# Patient Record
Sex: Female | Born: 1990 | Race: White | Hispanic: No | Marital: Married | Smoking: Former smoker
Health system: Southern US, Community
[De-identification: ages and names within clinical notes are randomized; demographics above are authoritative.]

## PROBLEM LIST (undated history)

## (undated) ENCOUNTER — Inpatient Hospital Stay (HOSPITAL_COMMUNITY): Payer: Self-pay

## (undated) DIAGNOSIS — J343 Hypertrophy of nasal turbinates: Secondary | ICD-10-CM

## (undated) DIAGNOSIS — J321 Chronic frontal sinusitis: Secondary | ICD-10-CM

## (undated) DIAGNOSIS — G43909 Migraine, unspecified, not intractable, without status migrainosus: Secondary | ICD-10-CM

## (undated) DIAGNOSIS — Z8614 Personal history of Methicillin resistant Staphylococcus aureus infection: Secondary | ICD-10-CM

## (undated) DIAGNOSIS — J322 Chronic ethmoidal sinusitis: Secondary | ICD-10-CM

## (undated) DIAGNOSIS — J342 Deviated nasal septum: Secondary | ICD-10-CM

## (undated) DIAGNOSIS — J320 Chronic maxillary sinusitis: Secondary | ICD-10-CM

## (undated) DIAGNOSIS — J3081 Allergic rhinitis due to animal (cat) (dog) hair and dander: Secondary | ICD-10-CM

## (undated) DIAGNOSIS — R0989 Other specified symptoms and signs involving the circulatory and respiratory systems: Secondary | ICD-10-CM

## (undated) DIAGNOSIS — J323 Chronic sphenoidal sinusitis: Secondary | ICD-10-CM

## (undated) DIAGNOSIS — Z9109 Other allergy status, other than to drugs and biological substances: Secondary | ICD-10-CM

## (undated) HISTORY — PX: WISDOM TOOTH EXTRACTION: SHX21

---

## 2012-09-01 DIAGNOSIS — Z8614 Personal history of Methicillin resistant Staphylococcus aureus infection: Secondary | ICD-10-CM

## 2012-09-01 HISTORY — DX: Personal history of Methicillin resistant Staphylococcus aureus infection: Z86.14

## 2013-08-23 HISTORY — PX: IRRIGATION AND DEBRIDEMENT FOOT: SHX6602

## 2014-12-01 HISTORY — PX: DIAGNOSTIC LAPAROSCOPY: SUR761

## 2016-04-30 ENCOUNTER — Ambulatory Visit (INDEPENDENT_AMBULATORY_CARE_PROVIDER_SITE_OTHER): Payer: Medicaid Other | Admitting: *Deleted

## 2016-04-30 DIAGNOSIS — Z3200 Encounter for pregnancy test, result unknown: Secondary | ICD-10-CM

## 2016-04-30 DIAGNOSIS — Z3201 Encounter for pregnancy test, result positive: Secondary | ICD-10-CM

## 2016-04-30 LAB — POCT URINE PREGNANCY: Preg Test, Ur: POSITIVE — AB

## 2016-04-30 NOTE — Patient Instructions (Signed)
Gave pt instructions to return to [facility] to [facility] after 05/26/2016 for NOB, gave precautions for MAU visits, and PNV samples.

## 2016-05-01 ENCOUNTER — Inpatient Hospital Stay (HOSPITAL_COMMUNITY): Payer: Medicaid Other

## 2016-05-01 ENCOUNTER — Encounter (HOSPITAL_COMMUNITY): Payer: Self-pay | Admitting: *Deleted

## 2016-05-01 ENCOUNTER — Inpatient Hospital Stay (HOSPITAL_COMMUNITY)
Admission: AD | Admit: 2016-05-01 | Discharge: 2016-05-01 | Disposition: A | Discharge status: Home / Self Care | Payer: Medicaid Other | Source: Ambulatory Visit | Attending: Obstetrics and Gynecology | Admitting: Obstetrics and Gynecology

## 2016-05-01 DIAGNOSIS — O26899 Other specified pregnancy related conditions, unspecified trimester: Secondary | ICD-10-CM

## 2016-05-01 DIAGNOSIS — R109 Unspecified abdominal pain: Secondary | ICD-10-CM

## 2016-05-01 DIAGNOSIS — R509 Fever, unspecified: Secondary | ICD-10-CM | POA: Diagnosis present

## 2016-05-01 DIAGNOSIS — Z3A01 Less than 8 weeks gestation of pregnancy: Secondary | ICD-10-CM | POA: Insufficient documentation

## 2016-05-01 DIAGNOSIS — O219 Vomiting of pregnancy, unspecified: Secondary | ICD-10-CM | POA: Diagnosis not present

## 2016-05-01 DIAGNOSIS — O99611 Diseases of the digestive system complicating pregnancy, first trimester: Secondary | ICD-10-CM | POA: Insufficient documentation

## 2016-05-01 DIAGNOSIS — O26891 Other specified pregnancy related conditions, first trimester: Secondary | ICD-10-CM | POA: Insufficient documentation

## 2016-05-01 DIAGNOSIS — R197 Diarrhea, unspecified: Secondary | ICD-10-CM | POA: Diagnosis present

## 2016-05-01 DIAGNOSIS — K219 Gastro-esophageal reflux disease without esophagitis: Secondary | ICD-10-CM | POA: Diagnosis not present

## 2016-05-01 DIAGNOSIS — R110 Nausea: Secondary | ICD-10-CM | POA: Diagnosis not present

## 2016-05-01 DIAGNOSIS — Z87891 Personal history of nicotine dependence: Secondary | ICD-10-CM | POA: Diagnosis not present

## 2016-05-01 DIAGNOSIS — O3680X0 Pregnancy with inconclusive fetal viability, not applicable or unspecified: Secondary | ICD-10-CM

## 2016-05-01 DIAGNOSIS — K529 Noninfective gastroenteritis and colitis, unspecified: Secondary | ICD-10-CM

## 2016-05-01 DIAGNOSIS — Z3491 Encounter for supervision of normal pregnancy, unspecified, first trimester: Secondary | ICD-10-CM

## 2016-05-01 DIAGNOSIS — R1110 Vomiting, unspecified: Secondary | ICD-10-CM | POA: Diagnosis present

## 2016-05-01 LAB — URINALYSIS, ROUTINE W REFLEX MICROSCOPIC
Bilirubin Urine: NEGATIVE
GLUCOSE, UA: NEGATIVE mg/dL
Ketones, ur: NEGATIVE mg/dL
Leukocytes, UA: NEGATIVE
Nitrite: NEGATIVE
PH: 6 (ref 5.0–8.0)
Protein, ur: NEGATIVE mg/dL
SPECIFIC GRAVITY, URINE: 1.01 (ref 1.005–1.030)

## 2016-05-01 LAB — WET PREP, GENITAL
CLUE CELLS WET PREP: NONE SEEN
Sperm: NONE SEEN
TRICH WET PREP: NONE SEEN
YEAST WET PREP: NONE SEEN

## 2016-05-01 LAB — URINE MICROSCOPIC-ADD ON: WBC, UA: NONE SEEN WBC/hpf (ref 0–5)

## 2016-05-01 LAB — CBC
HEMATOCRIT: 37.7 % (ref 36.0–46.0)
Hemoglobin: 13.3 g/dL (ref 12.0–15.0)
MCH: 32 pg (ref 26.0–34.0)
MCHC: 35.3 g/dL (ref 30.0–36.0)
MCV: 90.6 fL (ref 78.0–100.0)
PLATELETS: 181 10*3/uL (ref 150–400)
RBC: 4.16 MIL/uL (ref 3.87–5.11)
RDW: 12.7 % (ref 11.5–15.5)
WBC: 8.6 10*3/uL (ref 4.0–10.5)

## 2016-05-01 LAB — HCG, QUANTITATIVE, PREGNANCY: hCG, Beta Chain, Quant, S: 31970 m[IU]/mL — ABNORMAL HIGH (ref <5)

## 2016-05-01 LAB — ABO/RH: ABO/RH(D): A POS

## 2016-05-01 MED ORDER — METOCLOPRAMIDE HCL 10 MG PO TABS: 10.0000 mg | ORAL_TABLET | Freq: Three times a day (TID) | ORAL | 0 refills | Status: DC | PRN

## 2016-05-01 MED ORDER — LOPERAMIDE HCL 2 MG PO CAPS: 2.0000 mg | ORAL_CAPSULE | Freq: Once | ORAL | Status: DC

## 2016-05-01 MED ORDER — METOCLOPRAMIDE HCL 10 MG PO TABS: 10.0000 mg | ORAL_TABLET | Freq: Three times a day (TID) | ORAL | Status: DC | PRN

## 2016-05-01 NOTE — MAU Note (Signed)
Pt had a fever this morning - 99.9, took tylenol.  Then temp was 100.4.  Has had vomiting & diarrhea for the past week.  Has had diarrhea 7-8 times in last 24 hours.  Has lower abd cramping, denies bleeding.

## 2016-05-01 NOTE — MAU Provider Note (Addendum)
History     CSN: [id number]  Arrival date and time: 05/01/16 1253   First Provider Initiated Contact with Patient 05/01/16 1557      Chief Complaint  Patient presents with  . Diarrhea  . Emesis During Pregnancy  . Fever   Wanda Dalton is a 25yo [id number] at 7812w6d by LMP who presents today with fever, diarrhea and vomiting.  The fever has been present since this am with the highest temp being 100.4 F.  Pt tried Tylenol which did not relieve her symptoms.  She reports chills and rhinorrhea currently.  Denies recent sick contacts, sore throat.    The diarrhea and vomiting have been present for the past week.  Pt has not vomited today and previous vomit was normal gastric contents.  She has been drinking fluids and tolerating that well.  7-8 episodes of diarrhea in past 24 hrs.  She admits lower abdominal cramps with pain radiating to her back.  Denies blood in her stool and upper abdominal pain.  Denies vaginal bleeding and discharge.        OB History    Gravida Para Term Preterm AB Living   4 2   2 1      SAB TAB Ectopic Multiple Live Births   1       2      Past Medical History:  Diagnosis Date  . GERD (gastroesophageal reflux disease)   . Headache   . Seasonal allergies     Past Surgical History:  Procedure Laterality Date  . FOOT SURGERY    . LAPAROSCOPY      Family History  Problem Relation Age of Onset  . Cancer Mother   . Cancer Maternal Grandfather     Social History  Substance Use Topics  . Smoking status: Former Games developermoker  . Smokeless tobacco: Former NeurosurgeonUser  . Alcohol use No    Allergies: Allergies not on file  No prescriptions prior to admission.    Review of Systems  Constitutional: Positive for chills and fever. Negative for weight loss.  HENT: Positive for congestion. Negative for sore throat.   Eyes: Negative for blurred vision and double vision.  Respiratory: Negative for cough, sputum production and shortness of breath.   Gastrointestinal:  Positive for abdominal pain, diarrhea, nausea and vomiting. Negative for blood in stool and constipation.  Genitourinary: Negative for dysuria, flank pain and hematuria.  Musculoskeletal: Positive for myalgias.  Skin: Negative for rash.   Physical Exam   Blood pressure 133/64, pulse 71, temperature 97.7 F (36.5 C), temperature source Oral, resp. rate 18, height 5\' 11"  (1.803 m), weight 88 kg (194 lb), last menstrual period 03/14/2016.  Physical Exam  Nursing note and vitals reviewed. Constitutional: She is oriented to person, place, and time. She appears well-developed and well-nourished. No distress.  HENT:  Head: Normocephalic.  Mouth/Throat: No oropharyngeal exudate.  Clear rhinorrhea present  Cardiovascular: Normal rate, regular rhythm and normal heart sounds.   Respiratory: Effort normal and breath sounds normal. No respiratory distress.  GI: Soft. Normal appearance and bowel sounds are normal. She exhibits no distension and no mass. There is no tenderness. There is no guarding.  Genitourinary: Uterus normal. Vaginal discharge found.  Genitourinary Comments: Labia without erythema or lesions.  Speculum exam revealed pink rugae, small amount of white discharge around cervical os.  No CMT or fundal tenderness on bimanual  Musculoskeletal: Normal range of motion.  Neurological: She is alert and oriented to person, place, and time.  Skin:  Skin is warm and dry. No rash noted. She is not diaphoretic. No erythema.  Psychiatric: She has a normal mood and affect. Her behavior is normal.   Results for orders placed or performed during the hospital encounter of 05/01/16 (from the past 24 hour(s))  Urinalysis, Routine w reflex microscopic (not at [facility])     Status: Abnormal   Collection Time: 05/01/16  2:04 PM  Result Value Ref Range   Color, Urine YELLOW YELLOW   APPearance CLEAR CLEAR   Specific Gravity, Urine 1.010 1.005 - 1.030   pH 6.0 5.0 - 8.0   Glucose, UA NEGATIVE NEGATIVE mg/dL    Hgb urine dipstick TRACE (A) NEGATIVE   Bilirubin Urine NEGATIVE NEGATIVE   Ketones, ur NEGATIVE NEGATIVE mg/dL   Protein, ur NEGATIVE NEGATIVE mg/dL   Nitrite NEGATIVE NEGATIVE   Leukocytes, UA NEGATIVE NEGATIVE  Urine microscopic-add on     Status: Abnormal   Collection Time: 05/01/16  2:04 PM  Result Value Ref Range   Squamous Epithelial / LPF 0-5 (A) NONE SEEN   WBC, UA NONE SEEN 0 - 5 WBC/hpf   RBC / HPF 0-5 0 - 5 RBC/hpf   Bacteria, UA RARE (A) NONE SEEN   MAU Course  Procedures Metoclopramide and imodium given   MDM CBC, TVUS, beta HCG and ABO/Rh ordered as ectopic precautions.  Wet prep and GC/Chlamydia performed  Assessment and Plan  1. Suspected viral gastroenteritis 2. Nausea 2/2 early pregnancy  Adequate PO intake discussed with the pt. Pt to continue with Imodium until diarrhea resolves [place]   Arlice Colt, Medical Student 05/01/2016, 4:15 PM   Attestation of Attending Supervision of Medical Student: Evaluation and management procedures were performed by the Medical Student under my supervision.  I have seen and examined the patient, reviewed the the student's note and chart, and I agree with management and plan so far  Ultrasound showed IUP, patient reassured.  She was discharged to home with Imodium.  Told to follow up with primary OB, already has appointments scheduled.  Jaynie Collins, MD, FACOG Attending Obstetrician & Gynecologist Faculty Practice, [facility]

## 2016-05-01 NOTE — Discharge Instructions (Signed)
Viral Gastroenteritis °Viral gastroenteritis is also known as stomach flu. This condition affects the stomach and intestinal tract. It can cause sudden diarrhea and vomiting. The illness typically lasts 3 to 8 days. Most people develop an immune response that eventually gets rid of the virus. While this natural response develops, the virus can make you quite ill. °CAUSES  °Many different viruses can cause gastroenteritis, such as rotavirus or noroviruses. You can catch one of these viruses by consuming contaminated food or water. You may also catch a virus by sharing utensils or other personal items with an infected person or by touching a contaminated surface. °SYMPTOMS  °The most common symptoms are diarrhea and vomiting. These problems can cause a severe loss of body fluids (dehydration) and a body salt (electrolyte) imbalance. Other symptoms may include: °· Fever. °· Headache. °· Fatigue. °· Abdominal pain. °DIAGNOSIS  °Your caregiver can usually diagnose viral gastroenteritis based on your symptoms and a physical exam. A stool sample may also be taken to test for the presence of viruses or other infections. °TREATMENT  °This illness typically goes away on its own. Treatments are aimed at rehydration. The most serious cases of viral gastroenteritis involve vomiting so severely that you are not able to keep fluids down. In these cases, fluids must be given through an intravenous line (IV). °HOME CARE INSTRUCTIONS  °· Drink enough fluids to keep your urine clear or pale yellow. Drink small amounts of fluids frequently and increase the amounts as tolerated. °· Ask your caregiver for specific rehydration instructions. °· Avoid: °¨ Foods high in sugar. °¨ Alcohol. °¨ Carbonated drinks. °¨ Tobacco. °¨ Juice. °¨ Caffeine drinks. °¨ Extremely hot or cold fluids. °¨ Fatty, greasy foods. °¨ Too much intake of anything at one time. °¨ Dairy products until 24 to 48 hours after diarrhea stops. °· You may consume probiotics.  Probiotics are active cultures of beneficial bacteria. They may lessen the amount and number of diarrheal stools in adults. Probiotics can be found in yogurt with active cultures and in supplements. °· Wash your hands well to avoid spreading the virus. °· Only take over-the-counter or prescription medicines for pain, discomfort, or fever as directed by your caregiver. Do not give aspirin to children. Antidiarrheal medicines are not recommended. °· Ask your caregiver if you should continue to take your regular prescribed and over-the-counter medicines. °· Keep all follow-up appointments as directed by your caregiver. °SEEK IMMEDIATE MEDICAL CARE IF:  °· You are unable to keep fluids down. °· You do not urinate at least once every 6 to 8 hours. °· You develop shortness of breath. °· You notice blood in your stool or vomit. This may look like coffee grounds. °· You have abdominal pain that increases or is concentrated in one small area (localized). °· You have persistent vomiting or diarrhea. °· You have a fever. °· The patient is a child younger than 3 months, and he or she has a fever. °· The patient is a child older than 3 months, and he or she has a fever and persistent symptoms. °· The patient is a child older than 3 months, and he or she has a fever and symptoms suddenly get worse. °· The patient is a baby, and he or she has no tears when crying. °MAKE SURE YOU:  °· Understand these instructions. °· Will watch your condition. °· Will get help right away if you are not doing well or get worse. °  °This information is not intended to replace   advice given to you by your health care provider. Make sure you discuss any questions you have with your health care provider.   Document Released: 08/18/2005 Document Revised: 11/10/2011 Document Reviewed: 06/04/2011 Elsevier Interactive Patient Education 2016 Elsevier Inc. Morning Sickness Morning sickness is when you feel sick to your stomach (nauseous) during  pregnancy. This nauseous feeling may or may not come with vomiting. It often occurs in the morning but can be a problem any time of day. Morning sickness is most common during the first trimester, but it may continue throughout pregnancy. While morning sickness is unpleasant, it is usually harmless unless you develop severe and continual vomiting (hyperemesis gravidarum). This condition requires more intense treatment.  CAUSES  The cause of morning sickness is not completely known but seems to be related to normal hormonal changes that occur in pregnancy. RISK FACTORS You are at greater risk if you:  Experienced nausea or vomiting before your pregnancy.  Had morning sickness during a previous pregnancy.  Are pregnant with more than one baby, such as twins. TREATMENT  Do not use any medicines (prescription, over-the-counter, or herbal) for morning sickness without first talking to your health care provider. Your health care provider may prescribe or recommend:  Vitamin B6 supplements.  Anti-nausea medicines.  The herbal medicine ginger. HOME CARE INSTRUCTIONS   Only take over-the-counter or prescription medicines as directed by your health care provider.  Taking multivitamins before getting pregnant can prevent or decrease the severity of morning sickness in most women.  Eat a piece of dry toast or unsalted crackers before getting out of bed in the morning.  Eat five or six small meals a day.  Eat dry and bland foods (rice, baked potato). Foods high in carbohydrates are often helpful.  Do not drink liquids with your meals. Drink liquids between meals.  Avoid greasy, fatty, and spicy foods.  Get someone to cook for you if the smell of any food causes nausea and vomiting.  If you feel nauseous after taking prenatal vitamins, take the vitamins at night or with a snack.  Snack on protein foods (nuts, yogurt, cheese) between meals if you are hungry.  Eat unsweetened gelatins for  desserts.  Wearing an acupressure wristband (worn for sea sickness) may be helpful.  Acupuncture may be helpful.  Do not smoke.  Get a humidifier to keep the air in your house free of odors.  Get plenty of fresh air. SEEK MEDICAL CARE IF:   Your home remedies are not working, and you need medicine.  You feel dizzy or lightheaded.  You are losing weight. SEEK IMMEDIATE MEDICAL CARE IF:   You have persistent and uncontrolled nausea and vomiting.  You pass out (faint). MAKE SURE YOU:  Understand these instructions.  Will watch your condition.  Will get help right away if you are not doing well or get worse.   This information is not intended to replace advice given to you by your health care provider. Make sure you discuss any questions you have with your health care provider.   Document Released: 10/09/2006 Document Revised: 08/23/2013 Document Reviewed: 02/02/2013 Elsevier Interactive Patient Education [employer]2016 Elsevier Inc.

## 2016-05-01 NOTE — MAU Provider Note (Signed)
History     CSN: [id number]  Arrival date and time: 05/01/16 1253   First Provider Initiated Contact with Patient 05/01/16 1557      Chief Complaint  Patient presents with  . Diarrhea  . Emesis During Pregnancy  . Fever   [id number] at [redacted]w[redacted]d by LMP who presents today with fever, diarrhea and vomiting. The fever has been present since this am with the highest temp being 100.4 F. Tried Tylenol which did not relieve her symptoms. She reports chills and rhinorrhea currently.  Denies recent sick contacts, sore throat. The diarrhea and vomiting have been present for the past week.  Pt has not vomited today and previous vomit was normal gastric contents.  She has been drinking fluids and tolerating that well.  7-8 episodes of diarrhea in past 24 hrs.  She admits lower abdominal cramps with pain radiating to her back. Denies blood in her stool and upper abdominal pain.  Denies vaginal bleeding and discharge.       OB History    Gravida Para Term Preterm AB Living   4 2   2 1      SAB TAB Ectopic Multiple Live Births   1       2      Past Medical History:  Diagnosis Date  . GERD (gastroesophageal reflux disease)   . Headache   . Seasonal allergies     Past Surgical History:  Procedure Laterality Date  . FOOT SURGERY    . LAPAROSCOPY      Family History  Problem Relation Age of Onset  . Cancer Mother   . Cancer Maternal Grandfather     Social History  Substance Use Topics  . Smoking status: Former Games developer  . Smokeless tobacco: Former Neurosurgeon  . Alcohol use No    Allergies: Allergies not on file  No prescriptions prior to admission.    Review of Systems  Constitutional: Positive for chills and fever.  Gastrointestinal: Positive for abdominal pain, diarrhea and nausea. Negative for blood in stool.  Genitourinary: Negative.    Physical Exam   Blood pressure 133/64, pulse 71, temperature 97.7 F (36.5 C), temperature source Oral, resp. rate 18, height 5\' 11"  (1.803 m),  weight 88 kg (194 lb), last menstrual period 03/14/2016.  Physical Exam  Constitutional: She is oriented to person, place, and time. She appears well-developed and well-nourished.  HENT:  Head: Normocephalic and atraumatic.  Neck: Normal range of motion. Neck supple.  Cardiovascular: Normal rate.   Respiratory: Effort normal.  GI: Soft. She exhibits no distension and no mass. There is no tenderness. There is no rebound and no guarding.  Genitourinary:  Genitourinary Comments: External: no lesions Vagina: rugated, parous, thin white discharge Uterus: non enlarged, anteverted, non tender, no CMT Adnexae: no masses, no tenderness left, no tenderness right   Musculoskeletal: Normal range of motion.  Neurological: She is alert and oriented to person, place, and time.  Skin: Skin is warm and dry.  Psychiatric: She has a normal mood and affect.   Results for orders placed or performed during the hospital encounter of 05/01/16 (from the past 24 hour(s))  Urinalysis, Routine w reflex microscopic (not at [facility])     Status: Abnormal   Collection Time: 05/01/16  2:04 PM  Result Value Ref Range   Color, Urine YELLOW YELLOW   APPearance CLEAR CLEAR   Specific Gravity, Urine 1.010 1.005 - 1.030   pH 6.0 5.0 - 8.0   Glucose, UA NEGATIVE NEGATIVE mg/dL  Hgb urine dipstick TRACE (A) NEGATIVE   Bilirubin Urine NEGATIVE NEGATIVE   Ketones, ur NEGATIVE NEGATIVE mg/dL   Protein, ur NEGATIVE NEGATIVE mg/dL   Nitrite NEGATIVE NEGATIVE   Leukocytes, UA NEGATIVE NEGATIVE  Urine microscopic-add on     Status: Abnormal   Collection Time: 05/01/16  2:04 PM  Result Value Ref Range   Squamous Epithelial / LPF 0-5 (A) NONE SEEN   WBC, UA NONE SEEN 0 - 5 WBC/hpf   RBC / HPF 0-5 0 - 5 RBC/hpf   Bacteria, UA RARE (A) NONE SEEN  Wet prep, genital     Status: Abnormal   Collection Time: 05/01/16  4:05 PM  Result Value Ref Range   Yeast Wet Prep HPF POC NONE SEEN NONE SEEN   Trich, Wet Prep NONE SEEN NONE  SEEN   Clue Cells Wet Prep HPF POC NONE SEEN NONE SEEN   WBC, Wet Prep HPF POC MANY (A) NONE SEEN   Sperm NONE SEEN   ABO/Rh     Status: None (Preliminary result)   Collection Time: 05/01/16  4:40 PM  Result Value Ref Range   ABO/RH(D) A POS   CBC     Status: None   Collection Time: 05/01/16  4:57 PM  Result Value Ref Range   WBC 8.6 4.0 - 10.5 K/uL   RBC 4.16 3.87 - 5.11 MIL/uL   Hemoglobin 13.3 12.0 - 15.0 g/dL   HCT 16.1 09.6 - 04.5 %   MCV 90.6 78.0 - 100.0 fL   MCH 32.0 26.0 - 34.0 pg   MCHC 35.3 30.0 - 36.0 g/dL   RDW 40.9 81.1 - 91.4 %   Platelets 181 150 - 400 K/uL  hCG, quantitative, pregnancy     Status: Abnormal   Collection Time: 05/01/16  4:57 PM  Result Value Ref Range   hCG, Beta Chain, Quant, S 31,970 (H) <5 mIU/mL   US Ob Comp Less 14 Wks  Result Date: 05/01/2016 CLINICAL DATA:  Cramping affecting pregnancy, antepartum O99.89, R10.9 (ICD-10-CM) Pregnancy of unknown anatomic location Z33.1 (ICD-10-CM) EXAM: OBSTETRIC <14 WK [place] AND TRANSVAGINAL OB US TECHNIQUE: Both transabdominal and transvaginal ultrasound examinations were performed for complete evaluation of the gestation as well as the maternal uterus, adnexal regions, and pelvic cul-de-sac. Transvaginal technique was performed to assess early pregnancy. COMPARISON:  None. FINDINGS: Intrauterine gestational sac: Yes Yolk sac:  Yes Embryo:  Yes Cardiac Activity: Yes Heart Rate: 133  bpm CRL:  10.6  mm   7 w   1 d                  [place] EDC: 12/17/2016 Subchorionic hemorrhage:  Small subchronic hemorrhage. Maternal uterus/adnexae: No uterine masses. Normal right ovary. Left ovary not visualized. No adnexal masses. Trace pelvic free fluid. IMPRESSION: 1. Single live intrauterine pregnancy with a measured gestational age of [redacted] weeks and 1 day. 2. Small subchronic hemorrhage. No other evidence of a pregnancy complication. 3. Left ovary not visualized. Normal right ovary. No adnexal masses. No abnormal pelvic free fluid.  Electronically Signed   By: Amie Portland M.D.   On: 05/01/2016 19:16   US Ob Transvaginal  Result Date: 05/01/2016 CLINICAL DATA:  Cramping affecting pregnancy, antepartum O99.89, R10.9 (ICD-10-CM) Pregnancy of unknown anatomic location Z33.1 (ICD-10-CM) EXAM: OBSTETRIC <14 WK [place] AND TRANSVAGINAL OB US TECHNIQUE: Both transabdominal and transvaginal ultrasound examinations were performed for complete evaluation of the gestation as well as the maternal uterus, adnexal regions, and pelvic cul-de-sac.  Transvaginal technique was performed to assess early pregnancy. COMPARISON:  None. FINDINGS: Intrauterine gestational sac: Yes Yolk sac:  Yes Embryo:  Yes Cardiac Activity: Yes Heart Rate: 133  bpm CRL:  10.6  mm   7 w   1 d                  US EDC: 12/17/2016 Subchorionic hemorrhage:  Small subchronic hemorrhage. Maternal uterus/adnexae: No uterine masses. Normal right ovary. Left ovary not visualized. No adnexal masses. Trace pelvic free fluid. IMPRESSION: 1. Single live intrauterine pregnancy with a measured gestational age of [redacted] weeks and 1 day. 2. Small subchronic hemorrhage. No other evidence of a pregnancy complication. 3. Left ovary not visualized. Normal right ovary. No adnexal masses. No abnormal pelvic free fluid. Electronically Signed   By: Amie Portlandavid  Ormond M.D.   On: 05/01/2016 19:16    MAU Course  Procedures Reglan 10 mg po x1 Imodium 2 mg po x1  MDM Labs ordered and reviewed. Meds were not administered but patient feels well and would like discharge home. Normal viable IUP. Stable for discharge home.  Assessment and Plan   1. Gastroenteritis   2. Pregnancy of unknown anatomic location   3. Cramping affecting pregnancy, antepartum   4. Nausea and vomiting during pregnancy   5.      Viable pregnancy  Discharge home Reglan 10 mg po q8 hrs prn #30, no refill Diclegis OTC regimen Follow up with OB provider of choice in 1-2 weeks Return for worsening sx  Donette LarryMelanie Annalei Friesz, CNM 05/01/2016,  4:12 PM

## 2016-05-01 NOTE — MAU Note (Signed)
Hx of SAB; having cramping but denies any vaginal bleeding; anxious that she is miscarrying;

## 2016-05-02 LAB — GC/CHLAMYDIA PROBE AMP (~~LOC~~) NOT AT ARMC
Chlamydia: NEGATIVE
Neisseria Gonorrhea: NEGATIVE

## 2016-05-15 ENCOUNTER — Ambulatory Visit (INDEPENDENT_AMBULATORY_CARE_PROVIDER_SITE_OTHER): Payer: BLUE CROSS/BLUE SHIELD | Admitting: Otolaryngology

## 2016-05-15 DIAGNOSIS — J330 Polyp of nasal cavity: Secondary | ICD-10-CM

## 2016-05-15 DIAGNOSIS — J310 Chronic rhinitis: Secondary | ICD-10-CM

## 2016-05-28 ENCOUNTER — Encounter: Payer: Medicaid Other | Admitting: Certified Nurse Midwife

## 2016-10-08 ENCOUNTER — Encounter (HOSPITAL_COMMUNITY): Payer: Self-pay | Admitting: Family Medicine

## 2016-10-08 ENCOUNTER — Ambulatory Visit (HOSPITAL_COMMUNITY)
Admission: EM | Admit: 2016-10-08 | Discharge: 2016-10-08 | Disposition: A | Discharge status: Home / Self Care | Payer: Medicaid Other | Attending: Family Medicine | Admitting: Family Medicine

## 2016-10-08 DIAGNOSIS — J069 Acute upper respiratory infection, unspecified: Secondary | ICD-10-CM | POA: Diagnosis not present

## 2016-10-08 DIAGNOSIS — B9789 Other viral agents as the cause of diseases classified elsewhere: Secondary | ICD-10-CM

## 2016-10-08 MED ORDER — ALBUTEROL SULFATE HFA 108 (90 BASE) MCG/ACT IN AERS: 1.0000 | INHALATION_SPRAY | Freq: Four times a day (QID) | RESPIRATORY_TRACT | 0 refills | Status: DC | PRN

## 2016-10-08 MED ORDER — HYDROCODONE-HOMATROPINE 5-1.5 MG/5ML PO SYRP: 5.0000 mL | ORAL_SOLUTION | Freq: Four times a day (QID) | ORAL | 0 refills | Status: DC | PRN

## 2016-10-08 NOTE — ED Triage Notes (Signed)
Pt here for cough with streaks of blood since yesterday.

## 2016-10-08 NOTE — Discharge Instructions (Signed)
You most likely have a URI and are experiencing a condition called post-tussive emesis. I have refilled your albuterol inhaler. For cough I have prescribed hycodan cough syrup in consultation with Dr. Milus GlazierLauenstein. This medication is a narcotic and this medication is addictive and has potential to cause addiction to both you and your baby. Do not take this medication more than 3 days at the most. Should your symptoms persist I would recommend you follow up with your OB/GYN for further evaluation.

## 2016-10-08 NOTE — ED Notes (Signed)
Discharge pending pharmacy correction

## 2016-10-08 NOTE — ED Provider Notes (Signed)
CSN: [id number]     Arrival date & time 10/08/16  1111 History   None    Chief Complaint  Patient presents with  . Cough   (Consider location/radiation/quality/duration/timing/severity/associated sxs/prior Treatment) 26 year old female presents to clinic with chief complaint of cough with vomiting. States she does not feel nausea but has been coughing so hard she has vomited afterwards. She reports a low grade fever yesterday, has been taking plain mucinex at home. She is [redacted] weeks pregnant, under the care of an OB/GYN.   The history is provided by the patient.  Cough    Past Medical History:  Diagnosis Date  . GERD (gastroesophageal reflux disease)   . Headache   . Seasonal allergies    Past Surgical History:  Procedure Laterality Date  . FOOT SURGERY    . LAPAROSCOPY     Family History  Problem Relation Age of Onset  . Cancer Mother   . Cancer Maternal Grandfather    Social History  Substance Use Topics  . Smoking status: Former Games developer  . Smokeless tobacco: Former Neurosurgeon  . Alcohol use No   OB History    Gravida Para Term Preterm AB Living   4 2   2 1      SAB TAB Ectopic Multiple Live Births   1       2     Review of Systems  Reason unable to perform ROS: as covered in HPI.  Respiratory: Positive for cough.   All other systems reviewed and are negative.   Allergies  Bee venom and Latex  Home Medications   Prior to Admission medications   Medication Sig Start Date End Date Taking? Authorizing Provider  acetaminophen (TYLENOL) 500 MG tablet Take 1,000 mg by mouth every 6 (six) hours as needed for mild pain, moderate pain, fever or headache.    Historical Provider, MD  EPINEPHrine (EPIPEN 2-PAK) 0.3 mg/0.3 mL IJ SOAJ injection Inject 0.3 mg into the muscle once as needed (for severe allergic reaction).    Historical Provider, MD  metoCLOPramide (REGLAN) 10 MG tablet Take 1 tablet (10 mg total) by mouth every 8 (eight) hours as needed for nausea or vomiting.  05/01/16   Donette Larry, CNM  Prenatal Vit-Fe Fumarate-FA (PRENATAL MULTIVITAMIN) TABS tablet Take 1 tablet by mouth at bedtime.    Historical Provider, MD   Meds Ordered and Administered this Visit  Medications - No data to display  BP 105/56   Pulse 99   Temp 98.1 F (36.7 C)   Resp 20   LMP 03/14/2016   SpO2 97%  No data found.   Physical Exam  Constitutional: She is oriented to person, place, and time. She appears well-developed and well-nourished. She appears ill. No distress.  HENT:  Head: Normocephalic and atraumatic.  Right Ear: Tympanic membrane and external ear normal.  Left Ear: Tympanic membrane and external ear normal.  Nose: Nose normal. Right sinus exhibits no maxillary sinus tenderness and no frontal sinus tenderness. Left sinus exhibits no maxillary sinus tenderness and no frontal sinus tenderness.  Mouth/Throat: Uvula is midline and oropharynx is clear and moist. No oropharyngeal exudate.  Eyes: Pupils are equal, round, and reactive to light.  Neck: Normal range of motion. Neck supple. No JVD present.  Cardiovascular: Normal rate and regular rhythm.   Pulmonary/Chest: Effort normal. No respiratory distress. She has no wheezes. She has rhonchi in the right upper field, the right middle field, the left upper field and the left middle  field.  Abdominal: Soft. Bowel sounds are normal. She exhibits no distension. There is no tenderness. There is no guarding.  Lymphadenopathy:    She has no cervical adenopathy.  Neurological: She is alert and oriented to person, place, and time.  Skin: Skin is warm and dry. Capillary refill takes less than 2 seconds. She is not diaphoretic.  Psychiatric: She has a normal mood and affect.  Nursing note and vitals reviewed.   Urgent Care Course     Procedures (including critical care time)  Labs Review Labs Reviewed - No data to display  Imaging Review No results found.   Visual Acuity Review  Right Eye Distance:   Left  Eye Distance:   Bilateral Distance:    Right Eye Near:   Left Eye Near:    Bilateral Near:         MDM   1. Viral URI with cough    Consulted with Dr. Milus GlazierLauenstein with regard to selection of suitable cough medicine during this stage of pregnancy as well as consulted the [facility] [place] reporting system prior to issuing a prescription.  You most likely have a URI and are experiencing a condition called post-tussive emesis. I have refilled your albuterol inhaler. For cough I have prescribed hycodan cough syrup in consultation with Dr. Milus GlazierLauenstein. This medication is a narcotic and this medication is addictive and has potential to cause addiction to both you and your baby. Do not take this medication more than 3 days at the most. Should your symptoms persist I would recommend you follow up with your OB/GYN for further evaluation.       Wanda BodoLawrence Christorpher Hisaw, NP 10/08/16 1314

## 2017-01-01 ENCOUNTER — Ambulatory Visit (INDEPENDENT_AMBULATORY_CARE_PROVIDER_SITE_OTHER): Payer: Medicaid Other | Admitting: Otolaryngology

## 2017-01-01 DIAGNOSIS — J343 Hypertrophy of nasal turbinates: Secondary | ICD-10-CM | POA: Diagnosis not present

## 2017-01-01 DIAGNOSIS — J330 Polyp of nasal cavity: Secondary | ICD-10-CM

## 2017-01-15 ENCOUNTER — Ambulatory Visit (INDEPENDENT_AMBULATORY_CARE_PROVIDER_SITE_OTHER): Payer: Medicaid Other | Admitting: Otolaryngology

## 2017-01-15 DIAGNOSIS — J321 Chronic frontal sinusitis: Secondary | ICD-10-CM | POA: Diagnosis not present

## 2017-01-15 DIAGNOSIS — J342 Deviated nasal septum: Secondary | ICD-10-CM | POA: Diagnosis not present

## 2017-01-15 DIAGNOSIS — J330 Polyp of nasal cavity: Secondary | ICD-10-CM

## 2017-01-15 DIAGNOSIS — J320 Chronic maxillary sinusitis: Secondary | ICD-10-CM

## 2017-01-16 ENCOUNTER — Other Ambulatory Visit (INDEPENDENT_AMBULATORY_CARE_PROVIDER_SITE_OTHER): Payer: Self-pay | Admitting: Otolaryngology

## 2017-01-16 DIAGNOSIS — J329 Chronic sinusitis, unspecified: Secondary | ICD-10-CM

## 2017-01-27 ENCOUNTER — Ambulatory Visit (HOSPITAL_COMMUNITY)
Admission: RE | Admit: 2017-01-27 | Discharge: 2017-01-27 | Disposition: A | Discharge status: Home / Self Care | Payer: Medicaid Other | Source: Ambulatory Visit | Attending: Otolaryngology | Admitting: Otolaryngology

## 2017-01-27 ENCOUNTER — Encounter (HOSPITAL_COMMUNITY): Payer: Self-pay | Admitting: Radiology

## 2017-01-27 DIAGNOSIS — R938 Abnormal findings on diagnostic imaging of other specified body structures: Secondary | ICD-10-CM | POA: Diagnosis not present

## 2017-01-27 DIAGNOSIS — J329 Chronic sinusitis, unspecified: Secondary | ICD-10-CM | POA: Insufficient documentation

## 2017-02-02 ENCOUNTER — Ambulatory Visit (INDEPENDENT_AMBULATORY_CARE_PROVIDER_SITE_OTHER): Payer: Medicaid Other | Admitting: Otolaryngology

## 2017-02-02 DIAGNOSIS — J330 Polyp of nasal cavity: Secondary | ICD-10-CM | POA: Diagnosis not present

## 2017-02-02 DIAGNOSIS — J322 Chronic ethmoidal sinusitis: Secondary | ICD-10-CM

## 2017-02-02 DIAGNOSIS — J321 Chronic frontal sinusitis: Secondary | ICD-10-CM

## 2017-02-02 DIAGNOSIS — J320 Chronic maxillary sinusitis: Secondary | ICD-10-CM

## 2017-02-04 ENCOUNTER — Other Ambulatory Visit: Payer: Self-pay | Admitting: Otolaryngology

## 2017-03-01 ENCOUNTER — Emergency Department (HOSPITAL_COMMUNITY)
Admission: EM | Admit: 2017-03-01 | Discharge: 2017-03-01 | Disposition: A | Discharge status: Home / Self Care | Payer: Medicaid Other | Attending: Emergency Medicine | Admitting: Emergency Medicine

## 2017-03-01 ENCOUNTER — Encounter (HOSPITAL_COMMUNITY): Payer: Self-pay | Admitting: *Deleted

## 2017-03-01 DIAGNOSIS — Z91040 Latex allergy status: Secondary | ICD-10-CM | POA: Diagnosis not present

## 2017-03-01 DIAGNOSIS — J322 Chronic ethmoidal sinusitis: Secondary | ICD-10-CM

## 2017-03-01 DIAGNOSIS — Z87891 Personal history of nicotine dependence: Secondary | ICD-10-CM | POA: Insufficient documentation

## 2017-03-01 DIAGNOSIS — Z79899 Other long term (current) drug therapy: Secondary | ICD-10-CM | POA: Diagnosis not present

## 2017-03-01 DIAGNOSIS — J323 Chronic sphenoidal sinusitis: Secondary | ICD-10-CM

## 2017-03-01 DIAGNOSIS — J343 Hypertrophy of nasal turbinates: Secondary | ICD-10-CM

## 2017-03-01 DIAGNOSIS — J321 Chronic frontal sinusitis: Secondary | ICD-10-CM

## 2017-03-01 DIAGNOSIS — J320 Chronic maxillary sinusitis: Secondary | ICD-10-CM

## 2017-03-01 DIAGNOSIS — H0015 Chalazion left lower eyelid: Secondary | ICD-10-CM | POA: Diagnosis not present

## 2017-03-01 DIAGNOSIS — J342 Deviated nasal septum: Secondary | ICD-10-CM

## 2017-03-01 DIAGNOSIS — H5712 Ocular pain, left eye: Secondary | ICD-10-CM | POA: Diagnosis present

## 2017-03-01 HISTORY — DX: Chronic sphenoidal sinusitis: J32.3

## 2017-03-01 HISTORY — DX: Chronic frontal sinusitis: J32.1

## 2017-03-01 HISTORY — DX: Hypertrophy of nasal turbinates: J34.3

## 2017-03-01 HISTORY — DX: Chronic ethmoidal sinusitis: J32.2

## 2017-03-01 HISTORY — DX: Deviated nasal septum: J34.2

## 2017-03-01 HISTORY — DX: Chronic maxillary sinusitis: J32.0

## 2017-03-01 MED ORDER — ALBUTEROL SULFATE HFA 108 (90 BASE) MCG/ACT IN AERS: 2.0000 | INHALATION_SPRAY | RESPIRATORY_TRACT | 0 refills | Status: AC | PRN

## 2017-03-01 NOTE — ED Provider Notes (Signed)
[facility]-EMERGENCY DEPT Provider Note    By signing my name below, I, Earmon PhoenixJennifer Waddell, attest that this documentation has been prepared under the direction and in the presence of Graciella FreerLindsey Quandre Polinski, PA-C. Electronically Signed: Earmon PhoenixJennifer Waddell, ED Scribe. 03/01/17. 12:14 PM.    History   Chief Complaint Chief Complaint  Patient presents with  . Eye Problem    The history is provided by the patient and medical records. No language interpreter was used.    Wanda Dalton is a 26 y.o. female who presents to the Emergency Department complaining of left lower eyelid pain that worsened a few days ago. Patient states that this is been ongoing for approximately one month. She has not been evaluated for symptoms. She reports small bump to the lower eyelid that is painful. She has not taken anything for pain. Touching the area increases the pain. She denies alleviating factors. She denies eye redness, photophobia, visual changes. She denies wearing contact lenses but does wear corrective lenses.  She also reports being out of her MDI for the past few days. She states she has seasonal allergies and developed a bad cough a few months ago during pregnancy and was prescribed the MDI. She reports she has been experiencing some increased SOB lately but none today. She has not taken anything to treat the symptoms. She denies modifying factors. She denies current SOB. She does not currently have a PCP. She is currently breastfeeding.  Past Medical History:  Diagnosis Date  . GERD (gastroesophageal reflux disease)   . Headache   . Seasonal allergies     There are no active problems to display for this patient.   Past Surgical History:  Procedure Laterality Date  . FOOT SURGERY    . LAPAROSCOPY      OB History    Gravida Para Term Preterm AB Living   4 2   2 1      SAB TAB Ectopic Multiple Live Births   1       2       Home Medications    Prior to Admission medications   Medication Sig Start  Date End Date Taking? Authorizing Provider  acetaminophen (TYLENOL) 500 MG tablet Take 1,000 mg by mouth every 6 (six) hours as needed for mild pain, moderate pain, fever or headache.    [provider]  albuterol (PROVENTIL HFA;VENTOLIN HFA) 108 (90 Base) MCG/ACT inhaler Inhale 2 puffs into the lungs every 4 (four) hours as needed for wheezing or shortness of breath. 03/01/17   Maxwell CaulLayden, Avarae Zwart A, PA-C  EPINEPHrine (EPIPEN 2-PAK) 0.3 mg/0.3 mL IJ SOAJ injection Inject 0.3 mg into the muscle once as needed (for severe allergic reaction).    [provider]  HYDROcodone-homatropine (HYCODAN) 5-1.5 MG/5ML syrup Take 5 mLs by mouth every 6 (six) hours as needed for cough. 10/08/16   Dorena BodoKennard, Lawrence, NP  metoCLOPramide (REGLAN) 10 MG tablet Take 1 tablet (10 mg total) by mouth every 8 (eight) hours as needed for nausea or vomiting. 05/01/16   Donette LarryBhambri, Melanie, CNM  Prenatal Vit-Fe Fumarate-FA (PRENATAL MULTIVITAMIN) TABS tablet Take 1 tablet by mouth at bedtime.    [provider]    Family History Family History  Problem Relation Age of Onset  . Cancer Mother   . Cancer Maternal Grandfather     Social History Social History  Substance Use Topics  . Smoking status: Former Games developermoker  . Smokeless tobacco: Former NeurosurgeonUser  . Alcohol use No     Allergies  Bee venom and Latex   Review of Systems Review of Systems  Eyes: Positive for pain. Negative for photophobia, redness and visual disturbance.  Respiratory: Negative for shortness of breath.      Physical Exam Updated Vital Signs BP 122/84 (BP Location: Right Arm)   Pulse 82   Temp 98.1 F (36.7 C) (Oral)   Resp 18   Ht 6' (1.829 m)   Wt 226 lb (102.5 kg)   LMP 03/14/2016   SpO2 98%   BMI 30.65 kg/m   Physical Exam  Constitutional: She appears well-developed and well-nourished.  Sitting comfortably on examination table  HENT:  Head: Normocephalic and atraumatic.  Eyes: Conjunctivae and EOM are normal.  Pupils are equal, round, and reactive to light. Right eye exhibits no discharge. Left eye exhibits no discharge. Right conjunctiva is not injected. Left conjunctiva is not injected. No scleral icterus.  No periorbital tenderness, edema, or erythema bilaterally. EOMs intact without difficulty. Patient has a small white bump to the lower eyelid consistent with a chalazion. No upper or lower eyelid swelling or erythema.  Cardiovascular: Normal rate, regular rhythm and normal heart sounds.   Pulmonary/Chest: Effort normal and breath sounds normal. No respiratory distress.  No evidence of respiratory distress. Able to speak in full sentences without difficulty.  Neurological: She is alert.  Skin: Skin is warm and dry.  Psychiatric: She has a normal mood and affect. Her speech is normal and behavior is normal.  Nursing note and vitals reviewed.    ED Treatments / Results  DIAGNOSTIC STUDIES: Oxygen Saturation is 98% on RA, normal by my interpretation.   COORDINATION OF CARE: 12:08 PM- Will have visual acuity checked. Will instill tetracaine drops and apply fluorescein strip to check for abrasions/lacerations. Pt verbalizes understanding and agrees to plan.  Medications - No data to display  Labs (all labs ordered are listed, but only abnormal results are displayed) Labs Reviewed - No data to display  EKG  EKG Interpretation None       Radiology No results found.  Procedures Procedures (including critical care time)  Medications Ordered in ED Medications - No data to display   Initial Impression / Assessment and Plan / ED Course  I have reviewed the triage vital signs and the nursing notes.  Pertinent labs & imaging results that were available during my care of the patient were reviewed by me and considered in my medical decision making (see chart for details).     26 year old female who presents with bump to left lower eyelid 1 month. Began worsening a few days ago now  associated with pain. No vision changes. Patient is afebrile, non-toxic appearing, sitting comfortably on examination table. Vital signs reviewed and stable. Physical exam consistent with chalazion. Given that this is been ongoing for 1 month, we'll plan to give ophthalmology follow-up for further evaluation. Patient also states that she is out of her albuterol inhaler that she was prescribed several months ago which was pregnant. Patient states that she has recently been having some wheezing but denies any current symptoms. No respiratory distress, no wheezing or stridor on exam. Patient does not have a PCP at this time. Patient states that she initially went to the clinic this morning for symptoms but they were closed so she came to the emergency department. Will plan refill albuterol inhaler. Provided patient with a list of clinic resources to use if he does not have a PCP. Instructed to call them today to arrange follow-up in the  next 24-48 hours.  Provided ophthalmology referral. Strict return precautions discussed.     Right Eye Near: R Near: 20/20 Left Eye Near:  L Near: 20/40 Bilateral Near:  20/20   Final Clinical Impressions(s) / ED Diagnoses   Final diagnoses:  Chalazion of left lower eyelid    New Prescriptions Discharge Medication List as of 03/01/2017 12:52 PM      I personally performed the services described in this documentation, which was scribed in my presence. The recorded information has been reviewed and is accurate.     Maxwell Caul, PA-C 03/01/17 1717    Doug Sou, MD 03/01/17 [phone]

## 2017-03-01 NOTE — ED Triage Notes (Signed)
Pt has bump to left lower eyelid that is causing pain and also needs refill for her inhaler. No distress is noted at triage.

## 2017-03-01 NOTE — ED Notes (Signed)
Pt has white lesion in inner aspect of left lower lid. States has had this x 1 month but it seems to be getting larger and more tender.

## 2017-03-01 NOTE — Discharge Instructions (Signed)
As we discussed, apply warm compresses and massage area.  Follow-up with referred eye doctor if symptoms worsen.  Follow-up with either the [facility] wellness clinic or the clinics listed in the papers for primary care evaluation.  Return the emergency room for any pain, fever, redness/swelling of the eyelid, vision changes or any other worsening or concerning symptoms.

## 2017-03-10 ENCOUNTER — Encounter (HOSPITAL_BASED_OUTPATIENT_CLINIC_OR_DEPARTMENT_OTHER): Payer: Self-pay | Admitting: *Deleted

## 2017-03-10 DIAGNOSIS — R0989 Other specified symptoms and signs involving the circulatory and respiratory systems: Secondary | ICD-10-CM

## 2017-03-10 HISTORY — DX: Other specified symptoms and signs involving the circulatory and respiratory systems: R09.89

## 2017-03-17 ENCOUNTER — Ambulatory Visit (HOSPITAL_BASED_OUTPATIENT_CLINIC_OR_DEPARTMENT_OTHER): Payer: Medicaid Other | Admitting: Anesthesiology

## 2017-03-17 ENCOUNTER — Encounter (HOSPITAL_BASED_OUTPATIENT_CLINIC_OR_DEPARTMENT_OTHER): Payer: Self-pay

## 2017-03-17 ENCOUNTER — Ambulatory Visit (HOSPITAL_BASED_OUTPATIENT_CLINIC_OR_DEPARTMENT_OTHER)
Admission: RE | Admit: 2017-03-17 | Discharge: 2017-03-17 | Disposition: A | Discharge status: Home / Self Care | Payer: Medicaid Other | Source: Ambulatory Visit | Attending: Otolaryngology | Admitting: Otolaryngology

## 2017-03-17 ENCOUNTER — Encounter (HOSPITAL_BASED_OUTPATIENT_CLINIC_OR_DEPARTMENT_OTHER)
Admission: RE | Disposition: A | Discharge status: Home / Self Care | Payer: Self-pay | Source: Ambulatory Visit | Attending: Otolaryngology

## 2017-03-17 DIAGNOSIS — J343 Hypertrophy of nasal turbinates: Secondary | ICD-10-CM | POA: Diagnosis not present

## 2017-03-17 DIAGNOSIS — J3489 Other specified disorders of nose and nasal sinuses: Secondary | ICD-10-CM | POA: Diagnosis present

## 2017-03-17 DIAGNOSIS — J338 Other polyp of sinus: Secondary | ICD-10-CM | POA: Insufficient documentation

## 2017-03-17 DIAGNOSIS — J322 Chronic ethmoidal sinusitis: Secondary | ICD-10-CM | POA: Diagnosis not present

## 2017-03-17 DIAGNOSIS — J320 Chronic maxillary sinusitis: Secondary | ICD-10-CM | POA: Diagnosis not present

## 2017-03-17 DIAGNOSIS — J321 Chronic frontal sinusitis: Secondary | ICD-10-CM | POA: Diagnosis not present

## 2017-03-17 DIAGNOSIS — Z87891 Personal history of nicotine dependence: Secondary | ICD-10-CM | POA: Insufficient documentation

## 2017-03-17 DIAGNOSIS — J342 Deviated nasal septum: Secondary | ICD-10-CM

## 2017-03-17 DIAGNOSIS — J324 Chronic pansinusitis: Secondary | ICD-10-CM | POA: Insufficient documentation

## 2017-03-17 DIAGNOSIS — J323 Chronic sphenoidal sinusitis: Secondary | ICD-10-CM | POA: Diagnosis not present

## 2017-03-17 HISTORY — DX: Migraine, unspecified, not intractable, without status migrainosus: G43.909

## 2017-03-17 HISTORY — DX: Personal history of Methicillin resistant Staphylococcus aureus infection: Z86.14

## 2017-03-17 HISTORY — DX: Other specified symptoms and signs involving the circulatory and respiratory systems: R09.89

## 2017-03-17 HISTORY — DX: Allergic rhinitis due to animal (cat) (dog) hair and dander: J30.81

## 2017-03-17 HISTORY — DX: Chronic maxillary sinusitis: J32.0

## 2017-03-17 HISTORY — PX: NASAL SEPTOPLASTY W/ TURBINOPLASTY: SHX2070

## 2017-03-17 HISTORY — PX: FRONTAL SINUS EXPLORATION: SHX6591

## 2017-03-17 HISTORY — PX: MAXILLARY ANTROSTOMY: SHX2003

## 2017-03-17 HISTORY — DX: Deviated nasal septum: J34.2

## 2017-03-17 HISTORY — DX: Chronic sphenoidal sinusitis: J32.3

## 2017-03-17 HISTORY — PX: SPHENOIDECTOMY: SHX2421

## 2017-03-17 HISTORY — DX: Hypertrophy of nasal turbinates: J34.3

## 2017-03-17 HISTORY — DX: Chronic frontal sinusitis: J32.1

## 2017-03-17 HISTORY — PX: ETHMOIDECTOMY: SHX5197

## 2017-03-17 HISTORY — DX: Chronic ethmoidal sinusitis: J32.2

## 2017-03-17 HISTORY — PX: SINUS ENDO W/FUSION: SHX777

## 2017-03-17 HISTORY — DX: Other allergy status, other than to drugs and biological substances: Z91.09

## 2017-03-17 SURGERY — SEPTOPLASTY, NOSE, WITH NASAL TURBINATE REDUCTION
Anesthesia: General | Site: Nose | Laterality: Bilateral

## 2017-03-17 MED ORDER — MIDAZOLAM HCL 2 MG/2ML IJ SOLN
INTRAMUSCULAR | Status: AC
  Filled 2017-03-17: qty 2

## 2017-03-17 MED ORDER — OXYCODONE-ACETAMINOPHEN 5-325 MG PO TABS: 1.0000 | ORAL_TABLET | ORAL | 0 refills | Status: AC | PRN

## 2017-03-17 MED ORDER — FENTANYL CITRATE (PF) 100 MCG/2ML IJ SOLN
50.0000 ug | INTRAMUSCULAR | Status: AC | PRN
  Administered 2017-03-17: 100 ug via INTRAVENOUS
  Administered 2017-03-17 (×2): 50 ug via INTRAVENOUS

## 2017-03-17 MED ORDER — OXYMETAZOLINE HCL 0.05 % NA SOLN
NASAL | Status: DC | PRN
  Administered 2017-03-17: 1 via TOPICAL

## 2017-03-17 MED ORDER — FENTANYL CITRATE (PF) 100 MCG/2ML IJ SOLN
INTRAMUSCULAR | Status: AC
  Filled 2017-03-17: qty 2

## 2017-03-17 MED ORDER — AMOXICILLIN 875 MG PO TABS: 875.0000 mg | ORAL_TABLET | Freq: Two times a day (BID) | ORAL | 0 refills | Status: AC

## 2017-03-17 MED ORDER — ROCURONIUM BROMIDE 100 MG/10ML IV SOLN
INTRAVENOUS | Status: DC | PRN
  Administered 2017-03-17: 50 mg via INTRAVENOUS

## 2017-03-17 MED ORDER — ONDANSETRON HCL 4 MG/2ML IJ SOLN
INTRAMUSCULAR | Status: AC
  Filled 2017-03-17: qty 2

## 2017-03-17 MED ORDER — LACTATED RINGERS IV SOLN
INTRAVENOUS | Status: DC
  Administered 2017-03-17 (×3): via INTRAVENOUS

## 2017-03-17 MED ORDER — LIDOCAINE 2% (20 MG/ML) 5 ML SYRINGE
INTRAMUSCULAR | Status: DC | PRN
Start: 2017-03-17 — End: 2017-03-17
  Administered 2017-03-17: 60 mg via INTRAVENOUS

## 2017-03-17 MED ORDER — NEOSTIGMINE METHYLSULFATE 10 MG/10ML IV SOLN
INTRAVENOUS | Status: DC | PRN
  Administered 2017-03-17: 3 mg via INTRAVENOUS

## 2017-03-17 MED ORDER — HYDROMORPHONE HCL 1 MG/ML IJ SOLN
INTRAMUSCULAR | Status: AC
  Filled 2017-03-17: qty 0.5

## 2017-03-17 MED ORDER — MUPIROCIN 2 % EX OINT
TOPICAL_OINTMENT | CUTANEOUS | Status: DC | PRN
  Administered 2017-03-17: 1 via TOPICAL

## 2017-03-17 MED ORDER — HYDROMORPHONE HCL 1 MG/ML IJ SOLN
0.2500 mg | INTRAMUSCULAR | Status: DC | PRN
  Administered 2017-03-17 (×2): 0.5 mg via INTRAVENOUS

## 2017-03-17 MED ORDER — PROPOFOL 10 MG/ML IV BOLUS
INTRAVENOUS | Status: DC | PRN
  Administered 2017-03-17: 200 mg via INTRAVENOUS

## 2017-03-17 MED ORDER — MEPERIDINE HCL 25 MG/ML IJ SOLN: 6.2500 mg | INTRAMUSCULAR | Status: DC | PRN

## 2017-03-17 MED ORDER — LIDOCAINE HCL (CARDIAC) 20 MG/ML IV SOLN
INTRAVENOUS | Status: AC
  Filled 2017-03-17: qty 5

## 2017-03-17 MED ORDER — CEFAZOLIN SODIUM-DEXTROSE 2-3 GM-% IV SOLR
INTRAVENOUS | Status: DC | PRN
  Administered 2017-03-17: 2 g via INTRAVENOUS

## 2017-03-17 MED ORDER — FENTANYL CITRATE (PF) 100 MCG/2ML IJ SOLN: 25.0000 ug | INTRAMUSCULAR | Status: DC | PRN

## 2017-03-17 MED ORDER — SUCCINYLCHOLINE CHLORIDE 200 MG/10ML IV SOSY
PREFILLED_SYRINGE | INTRAVENOUS | Status: AC
  Filled 2017-03-17: qty 10

## 2017-03-17 MED ORDER — GLYCOPYRROLATE 0.2 MG/ML IJ SOLN
INTRAMUSCULAR | Status: DC | PRN
  Administered 2017-03-17: 0.3 mg via INTRAVENOUS

## 2017-03-17 MED ORDER — MIDAZOLAM HCL 2 MG/2ML IJ SOLN
1.0000 mg | INTRAMUSCULAR | Status: DC | PRN
  Administered 2017-03-17: 2 mg via INTRAVENOUS

## 2017-03-17 MED ORDER — LIDOCAINE-EPINEPHRINE 1 %-1:100000 IJ SOLN
INTRAMUSCULAR | Status: DC | PRN
  Administered 2017-03-17: 2 mL

## 2017-03-17 MED ORDER — ONDANSETRON HCL 4 MG/2ML IJ SOLN: 4.0000 mg | Freq: Once | INTRAMUSCULAR | Status: DC | PRN

## 2017-03-17 MED ORDER — SCOPOLAMINE 1 MG/3DAYS TD PT72: 1.0000 | MEDICATED_PATCH | Freq: Once | TRANSDERMAL | Status: DC | PRN

## 2017-03-17 MED ORDER — ONDANSETRON HCL 4 MG/2ML IJ SOLN
INTRAMUSCULAR | Status: DC | PRN
  Administered 2017-03-17: 4 mg via INTRAVENOUS

## 2017-03-17 MED ORDER — DEXAMETHASONE SODIUM PHOSPHATE 10 MG/ML IJ SOLN
INTRAMUSCULAR | Status: AC
  Filled 2017-03-17: qty 1

## 2017-03-17 MED ORDER — NEOSTIGMINE METHYLSULFATE 5 MG/5ML IV SOSY
PREFILLED_SYRINGE | INTRAVENOUS | Status: AC
  Filled 2017-03-17: qty 5

## 2017-03-17 SURGICAL SUPPLY — 65 items
ATTRACTOMAT 16X20 MAGNETIC DRP (DRAPES) IMPLANT
BLADE RAD40 ROTATE 4M 4 5PK (BLADE) IMPLANT
BLADE RAD40 ROTATE 4M 4MM 5PK (BLADE)
BLADE RAD60 ROTATE M4 4 5PK (BLADE) IMPLANT
BLADE RAD60 ROTATE M4 4MM 5PK (BLADE)
BLADE ROTATE RAD 12 4 M4 (BLADE) IMPLANT
BLADE ROTATE RAD 12 4MM M4 (BLADE)
BLADE ROTATE RAD 40 4 M4 (BLADE) IMPLANT
BLADE ROTATE RAD 40 4MM M4 (BLADE)
BLADE ROTATE TRICUT 4MX13CM M4 (BLADE) ×1
BLADE ROTATE TRICUT 4X13 M4 (BLADE) ×2 IMPLANT
BLADE TRICUT ROTATE M4 4 5PK (BLADE) IMPLANT
BLADE TRICUT ROTATE M4 4MM 5PK (BLADE)
BUR HS RAD FRONTAL 3 (BURR) IMPLANT
BUR HS RAD FRONTAL 3MM (BURR)
CANISTER SUC SOCK COL 7IN (MISCELLANEOUS) ×6 IMPLANT
CANISTER SUCT 1200ML W/VALVE (MISCELLANEOUS) ×3 IMPLANT
COAGULATOR SUCT 6 FR SWTCH (ELECTROSURGICAL)
COAGULATOR SUCT 8FR VV (MISCELLANEOUS) ×3 IMPLANT
COAGULATOR SUCT SWTCH 10FR 6 (ELECTROSURGICAL) IMPLANT
DECANTER SPIKE VIAL GLASS SM (MISCELLANEOUS) IMPLANT
DRSG NASAL KENNEDY LMNT 8CM (GAUZE/BANDAGES/DRESSINGS) IMPLANT
DRSG NASOPORE 8CM (GAUZE/BANDAGES/DRESSINGS) ×3 IMPLANT
DRSG TELFA 3X8 NADH (GAUZE/BANDAGES/DRESSINGS) IMPLANT
ELECT REM PT RETURN 9FT ADLT (ELECTROSURGICAL) ×3
ELECTRODE REM PT RTRN 9FT ADLT (ELECTROSURGICAL) ×1 IMPLANT
GLOVE BIO SURGEON STRL SZ7.5 (GLOVE) IMPLANT
GLOVE SURG SS PI 7.0 STRL IVOR (GLOVE) ×6 IMPLANT
GLOVE SURG SS PI 7.5 STRL IVOR (GLOVE) ×3 IMPLANT
GOWN STRL REUS W/ TWL LRG LVL3 (GOWN DISPOSABLE) ×3 IMPLANT
GOWN STRL REUS W/TWL LRG LVL3 (GOWN DISPOSABLE) ×6
HEMOSTAT SURGICEL 2X14 (HEMOSTASIS) IMPLANT
IV NS 1000ML (IV SOLUTION) ×2
IV NS 1000ML BAXH (IV SOLUTION) ×1 IMPLANT
IV NS 500ML (IV SOLUTION)
IV NS 500ML BAXH (IV SOLUTION) IMPLANT
NEEDLE HYPO 25X1 1.5 SAFETY (NEEDLE) ×3 IMPLANT
NEEDLE PRECISIONGLIDE 27X1.5 (NEEDLE) IMPLANT
NEEDLE SPNL 25GX3.5 QUINCKE BL (NEEDLE) IMPLANT
NS IRRIG 1000ML POUR BTL (IV SOLUTION) ×3 IMPLANT
PACK BASIN DAY SURGERY FS (CUSTOM PROCEDURE TRAY) ×3 IMPLANT
PACK ENT DAY SURGERY (CUSTOM PROCEDURE TRAY) ×3 IMPLANT
PACKING NASAL EPIS 4X2.4 XEROG (MISCELLANEOUS) IMPLANT
SLEEVE SCD COMPRESS KNEE MED (MISCELLANEOUS) ×3 IMPLANT
SOLUTION BUTLER CLEAR DIP (MISCELLANEOUS) ×3 IMPLANT
SPLINT NASAL AIRWAY SILICONE (MISCELLANEOUS) ×3 IMPLANT
SPONGE GAUZE 2X2 8PLY STER LF (GAUZE/BANDAGES/DRESSINGS) ×1
SPONGE GAUZE 2X2 8PLY STRL LF (GAUZE/BANDAGES/DRESSINGS) ×2 IMPLANT
SPONGE NEURO XRAY DETECT 1X3 (DISPOSABLE) ×3 IMPLANT
SUT CHROMIC 4 0 P 3 18 (SUTURE) ×3 IMPLANT
SUT ETHILON 3 0 PS 1 (SUTURE) ×3 IMPLANT
SUT PLAIN 4 0 ~~LOC~~ 1 (SUTURE) ×3 IMPLANT
SUT PROLENE 3 0 PS 2 (SUTURE) ×3 IMPLANT
SUT VIC AB 4-0 P-3 18XBRD (SUTURE) IMPLANT
SUT VIC AB 4-0 P3 18 (SUTURE)
SYR 3ML 23GX1 SAFETY (SYRINGE) IMPLANT
TOWEL OR 17X24 6PK STRL BLUE (TOWEL DISPOSABLE) ×3 IMPLANT
TRACKER ENT INSTRUMENT (MISCELLANEOUS) ×3 IMPLANT
TRACKER ENT PATIENT (MISCELLANEOUS) ×3 IMPLANT
TUBE CONNECTING 20'X1/4 (TUBING) ×1
TUBE CONNECTING 20X1/4 (TUBING) ×2 IMPLANT
TUBE SALEM SUMP 12R W/ARV (TUBING) IMPLANT
TUBE SALEM SUMP 16 FR W/ARV (TUBING) ×3 IMPLANT
TUBING STRAIGHTSHOT EPS 5PK (TUBING) ×3 IMPLANT
YANKAUER SUCT BULB TIP NO VENT (SUCTIONS) ×3 IMPLANT

## 2017-03-17 NOTE — Op Note (Signed)
DATE OF PROCEDURE: 03/17/2017  OPERATIVE REPORT   SURGEON: Newman PiesSu Miroslav Gin, MD   PREOPERATIVE DIAGNOSES:  1. Severe nasal septal deviation.  2. Bilateral inferior turbinate hypertrophy.  3. Chronic nasal obstruction. 4. Bilateral chronic pansinusitis with sinonasal polyps.  POSTOPERATIVE DIAGNOSES:  1. Severe nasal septal deviation.  2. Bilateral inferior turbinate hypertrophy.  3. Chronic nasal obstruction. 4. Bilateral chronic pansinusitis with sinonasal polyps.  PROCEDURE PERFORMED:  1. Septoplasty (CPT [id number]).  2. Bilateral partial inferior turbinate resection (CPT 30130-50).  3. Bilateral endoscopic total ethmoidectomy and sphenoidectomy with polyp removal (CPT 31259-50). 4. Bilateral endoscopic frontal sinusotomy with polyp removal (CPT [phone]). 5. Bilateral endoscopic maxillary antrostomy with polyp removal (CPT [phone]). 6. FUSION stereotactic image guidance (CPT [phone]).  ANESTHESIA: General endotracheal tube anesthesia.   COMPLICATIONS: None.   ESTIMATED BLOOD LOSS: 500 mL.   INDICATION FOR PROCEDURE: Wanda Dalton is a 26 y.o. female with a history of bilateral chronic nasal obstruction, chronic anosmia, and bilateral sinonasal polyps. She was noted to have near 100% obstruction of her nasal cavities bilaterally.  The patient was previously treated with multiple courses of topical and systemic steroids, immunotherapy, antihistamine, and decongestant.  However, she continues to be symptomatic.  She recently underwent a paranasal sinus CT scan.  The scan showed significant opacification and all four paranasal sinuses. Based on the above findings, the decision was made for the patient to undergo the above-stated procedure. The risks, benefits, alternatives, and details of the procedure were discussed with the patient. Questions were invited and answered. Informed consent was obtained.   DESCRIPTION OF PROCEDURE: The patient was taken to the operating room and placed supine on the  operating table. General endotracheal tube anesthesia was administered by the anesthesiologist. The patient was positioned, and prepped and draped in the standard fashion for nasal surgery. The FUSION stereotactic image guidance marker was placed. The FUSION stereotactic image guidance system was functional throughout the case.  Using a 0 scope, both nasal cavities were examined. Polypoid tissue was noted to obstruct both nasal cavities, causing 100% obstruction of her nasal passageways. Attention was first focused on the left nasal cavity. The nasal polyps were removed using a combination of Blakesley forceps and microdebrider. The same procedure was repeated on the right side.  Pledgets soaked with Afrin were placed in both nasal cavities for decongestion. The pledgets were subsequently removed. The above mentioned severe septal deviation was again noted. 1% lidocaine with 1:100,000 epinephrine was injected onto the nasal septum bilaterally. A hemitransfixion incision was made on the left side. The mucosal flap was carefully elevated on the left side. A cartilaginous incision was made 1 cm superior to the caudal margin of the nasal septum. Mucosal flap was also elevated on the right side in the similar fashion. It should be noted that due to the severe septal deviation, the deviated portion of the cartilaginous and bony septum had to be removed in piecemeal fashion. Once the deviated portions were removed, a straight midline septum was achieved. The septum was then quilted with 4-0 plain gut sutures. The hemitransfixion incision was closed with interrupted 4-0 chromic sutures.   Attention was then focused on the inferior turbinates. The inferior one half of both hypertrophied inferior turbinate was crossclamped with a Kelly clamp. The inferior one half of each inferior turbinate was then resected with a pair of cross cutting scissors. Hemostasis was achieved with a suction cautery device.   Attention was  then focused on the left paranasal sinuses. The middle turbinate was  carefully medialized using a freer elevator. More polypoid tissue was removed from the middle meatus. The anterior and posterior ethmoid cavities were opened. Polypoid tissue was removed from the anterior and posterior ethmoid cavities. The maxillary antrum was subsequently entered and enlarged. A large amount of polypoid tissue was removed from the maxillary sinus. The anterior wall of the left sphenoid cavity was taken down. Polypoid tissue was removed from the left sphenoid cavity. Attention was then turned to the frontal sinus. The frontal recess was enlarged, with polypoid tissue removed from the frontal sinus. All 4 paranasal sinuses were copiously irrigated. The same procedure was then repeated on the right side without exception. All sinuses were noted to be filled with polypoid tissue.  The care of the patient was turned over to the anesthesiologist. The patient was awakened from anesthesia without difficulty. The patient was extubated and transferred to the recovery room in good condition.   OPERATIVE FINDINGS: Severe nasal septal deviation and bilateral inferior turbinate hypertrophy.  Polypoid tissue was noted to fill all 4 pairs of paranasal sinuses.  SPECIMEN: None.   FOLLOWUP CARE: The patient be discharged home once she is awake and alert.  The patient will follow up in my office in approximately 1 week for splint removal.   Amr Sturtevant Philomena Doheny, MD

## 2017-03-17 NOTE — Anesthesia Preprocedure Evaluation (Addendum)
Anesthesia Evaluation  Patient identified by MRN, date of birth, ID band Patient awake    Reviewed: Allergy & Precautions, H&P , NPO status , Patient's Chart, lab work & pertinent test results  Airway Mallampati: I  TM Distance: >3 FB Neck ROM: Full    Dental no notable dental hx. (+) Teeth Intact, Dental Advisory Given   Pulmonary neg pulmonary ROS, former smoker,    Pulmonary exam normal breath sounds clear to auscultation       Cardiovascular negative cardio ROS   Rhythm:Regular Rate:Normal     Neuro/Psych  Headaches, negative psych ROS   GI/Hepatic negative GI ROS, Neg liver ROS,   Endo/Other  negative endocrine ROS  Renal/GU negative Renal ROS  negative genitourinary   Musculoskeletal   Abdominal   Peds  Hematology negative hematology ROS (+)   Anesthesia Other Findings   Reproductive/Obstetrics negative OB ROS                            Anesthesia Physical Anesthesia Plan  ASA: II  Anesthesia Plan: General   Post-op Pain Management:    Induction: Intravenous  PONV Risk Score and Plan: 4 or greater and Ondansetron, Dexamethasone, Midazolam and Diphenhydramine  Airway Management Planned: Oral ETT  Additional Equipment:   Intra-op Plan:   Post-operative Plan: Extubation in OR  Informed Consent: I have reviewed the patients History and Physical, chart, labs and discussed the procedure including the risks, benefits and alternatives for the proposed anesthesia with the patient or authorized representative who has indicated his/her understanding and acceptance.   Dental advisory given  Plan Discussed with: CRNA  Anesthesia Plan Comments:        Anesthesia Quick Evaluation

## 2017-03-17 NOTE — Anesthesia Postprocedure Evaluation (Signed)
Anesthesia Post Note  Patient: Wanda Dalton  Procedure(s) Performed: Procedure(s) (LRB): NASAL SEPTOPLASTY WITH BILATERAL TURBINATE REDUCTION (Bilateral) ENDOSCOPIC SINUS SURGERY WITH FUSION NAVIGATION (Bilateral) MAXILLARY ANTROSTOMY (Bilateral) FRONTAL SINUS EXPLORATION (Bilateral) ETHMOIDECTOMY (Bilateral) SPHENOIDECTOMY (Bilateral)     Anesthesia Post Evaluation  Last Vitals:  Vitals:   03/17/17 1104 03/17/17 1115  BP:  128/78  Pulse: 62 63  Resp: 14 14  Temp:      Last Pain:  Vitals:   03/17/17 0738  TempSrc: Oral                 [name]

## 2017-03-17 NOTE — Discharge Instructions (Addendum)

## 2017-03-17 NOTE — Anesthesia Procedure Notes (Signed)
Procedure Name: Intubation Date/Time: 03/17/2017 8:41 AM Performed by: Caren MacadamARTER, Vanna Shavers W Pre-anesthesia Checklist: Patient identified, Emergency Drugs available, Suction available and Patient being monitored Patient Re-evaluated:Patient Re-evaluated prior to induction Oxygen Delivery Method: Circle system utilized Preoxygenation: Pre-oxygenation with 100% oxygen Induction Type: IV induction Ventilation: Mask ventilation without difficulty Laryngoscope Size: Miller and 2 Grade View: Grade I Tube type: Oral Number of attempts: 1 Airway Equipment and Method: Stylet and Oral airway Placement Confirmation: ETT inserted through vocal cords under direct vision,  positive ETCO2 and breath sounds checked- equal and bilateral Secured at: 22 cm Tube secured with: Tape Dental Injury: Teeth and Oropharynx as per pre-operative assessment

## 2017-03-17 NOTE — H&P (Signed)
Cc: Chronic nasal obstruction, sinonasal polyposis, chronic rhinosinusitis  HPI: The patient is a 26 year old female who returns today for her follow-up evaluation.  The patient has a history of bilateral chronic nasal obstruction, chronic anosmia, and bilateral sinonasal polyps. She was previously noted to have near 100% obstruction of her nasal cavities bilaterally.  The patient was previously treated with multiple courses of topical and systemic steroids, immunotherapy, antihistamine, and decongestant.  However, she continues to be symptomatic.  She recently underwent a paranasal sinus CT scan.  The scan showed significant opacification and all four paranasal sinuses.  In addition, she also was noted to have nasal septal deviation and bilateral inferior turbinate hypertrophy.  The patient returns today reporting no improvement in her nasal obstruction and anosmia.  She is interested in more definitive treatment of her conditions.   Exam: The nasal cavities were decongested and anesthetised with a combination of oxymetazoline and 4% lidocaine solution.  The flexible scope was inserted into the right nasal cavity.  Endoscopy of the inferior and middle meatus was performed.  Edematous mucosa was noted.  No mass or lesion was appreciated. Large polyp noted at the posterior nasal cavity.   NSD noted. Nasopharynx was clear.  Turbinates were hypertrophied but without mass.  Incomplete response to decongestion.  The procedure was repeated on the contralateral side with similar findings. Polyp on the left causing almost complete obstruction.  The patient tolerated the procedure well.  Instructions were given to avoid eating or drinking for 2 hours.    Assessment: 1.  Bilateral chronic nasal obstruction secondary to sinonasal polyps, nasal septal deviation, and bilateral inferior turbinate hypertrophy.  All four pairs of paranasal sinuses are all involved.  Plan:  1.  The nasal endoscopy findings and the CT  images are reviewed with the patient.  2.  The patient should continue with Flonase nasal spray.   3.  Based on the above findings, the decision is made for the patient to undergo bilateral endoscopic sinus surgery to treat all four pairs of sinuses, septoplasty, and bilateral partial inferior turbinate resection.  The risks, benefits, alternatives and details of the procedures are reviewed with the patient.  The patient would like to proceed with the procedure.

## 2017-03-17 NOTE — Transfer of Care (Signed)
Immediate Anesthesia Transfer of Care Note  Patient: Wanda Dalton  Procedure(s) Performed: Procedure(s): NASAL SEPTOPLASTY WITH BILATERAL TURBINATE REDUCTION (Bilateral) ENDOSCOPIC SINUS SURGERY WITH FUSION NAVIGATION (Bilateral) MAXILLARY ANTROSTOMY (Bilateral) FRONTAL SINUS EXPLORATION (Bilateral) ETHMOIDECTOMY (Bilateral) SPHENOIDECTOMY (Bilateral)  Patient Location: PACU  Anesthesia Type:General  Level of Consciousness: awake  Airway & Oxygen Therapy: Patient Spontanous Breathing and Patient connected to face mask oxygen  Post-op Assessment: Report given to RN and Post -op Vital signs reviewed and stable  Post vital signs: Reviewed and stable  Last Vitals:  Vitals:   03/17/17 0738  BP: 123/68  Pulse: 71  Resp: (!) 22  Temp: 36.8 C    Last Pain:  Vitals:   03/17/17 0738  TempSrc: Oral         Complications: No apparent anesthesia complications

## 2017-03-18 ENCOUNTER — Encounter (HOSPITAL_BASED_OUTPATIENT_CLINIC_OR_DEPARTMENT_OTHER): Payer: Self-pay | Admitting: Otolaryngology

## 2017-03-19 ENCOUNTER — Ambulatory Visit (INDEPENDENT_AMBULATORY_CARE_PROVIDER_SITE_OTHER): Payer: Medicaid Other | Admitting: Otolaryngology

## 2017-03-19 DIAGNOSIS — J320 Chronic maxillary sinusitis: Secondary | ICD-10-CM | POA: Diagnosis not present

## 2017-03-19 DIAGNOSIS — J338 Other polyp of sinus: Secondary | ICD-10-CM

## 2017-03-19 DIAGNOSIS — J322 Chronic ethmoidal sinusitis: Secondary | ICD-10-CM | POA: Diagnosis not present

## 2017-03-19 DIAGNOSIS — J321 Chronic frontal sinusitis: Secondary | ICD-10-CM | POA: Diagnosis not present

## 2017-03-19 NOTE — Addendum Note (Signed)
Addendum  created 03/19/17 0921 by Bethena Midgetddono, Macaiah Mangal, MD   Anesthesia Attestations filed

## 2017-04-02 ENCOUNTER — Ambulatory Visit (INDEPENDENT_AMBULATORY_CARE_PROVIDER_SITE_OTHER): Payer: BLUE CROSS/BLUE SHIELD | Admitting: Otolaryngology

## 2017-04-02 DIAGNOSIS — J321 Chronic frontal sinusitis: Secondary | ICD-10-CM | POA: Diagnosis not present

## 2017-04-02 DIAGNOSIS — J320 Chronic maxillary sinusitis: Secondary | ICD-10-CM

## 2017-04-02 DIAGNOSIS — J322 Chronic ethmoidal sinusitis: Secondary | ICD-10-CM | POA: Diagnosis not present

## 2017-04-02 DIAGNOSIS — J323 Chronic sphenoidal sinusitis: Secondary | ICD-10-CM

## 2017-06-16 ENCOUNTER — Ambulatory Visit (HOSPITAL_COMMUNITY): Payer: BLUE CROSS/BLUE SHIELD | Admitting: Psychiatry

## 2019-03-24 IMAGING — CT CT MAXILLOFACIAL W/O CM
3 series · 14 of 47 positions shown, 16 images · non-contrast
Comparison: None.

CLINICAL DATA: Chronic sinusitis.

EXAM:
CT MAXILLOFACIAL WITHOUT CONTRAST
TECHNIQUE: Multidetector CT imaging of the sinus structures was performed.
Multiplanar CT image reconstructions were also generated. A small
metallic BB was placed on the right temple in order to reliably
differentiate right from left.FUSION protocol.

[Series 2: standard · axial · 0.37mm/px · z∈[+927,+1049]mm · 8 of 142 slices shown, 10 images]
[im 10/142  brain]
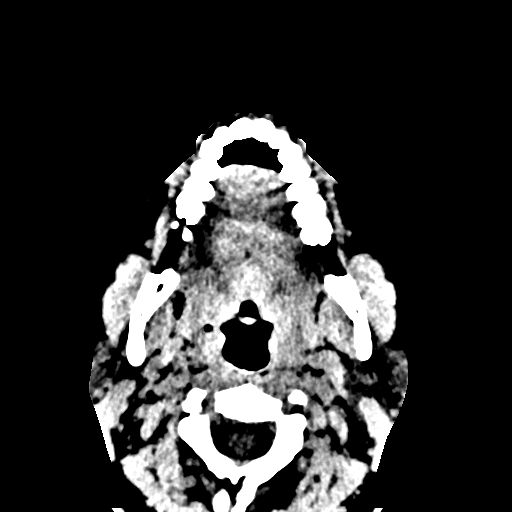
[im 10/142  bone]
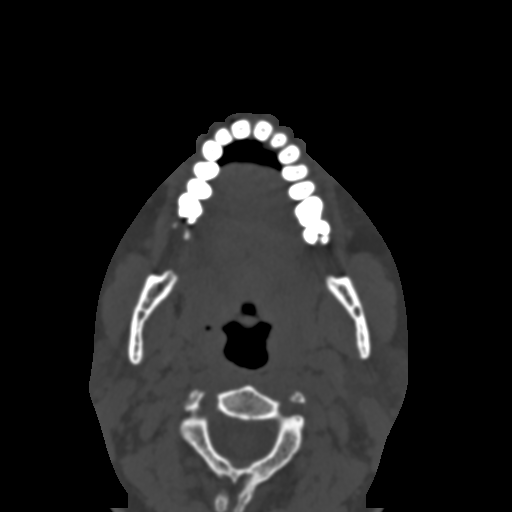
[im 30/142  bone]
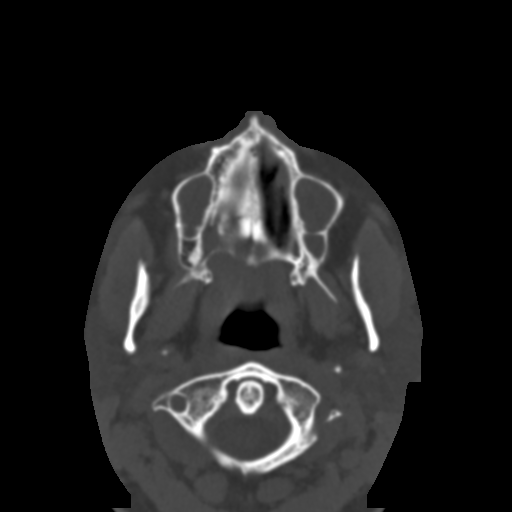
[im 44/142  bone]
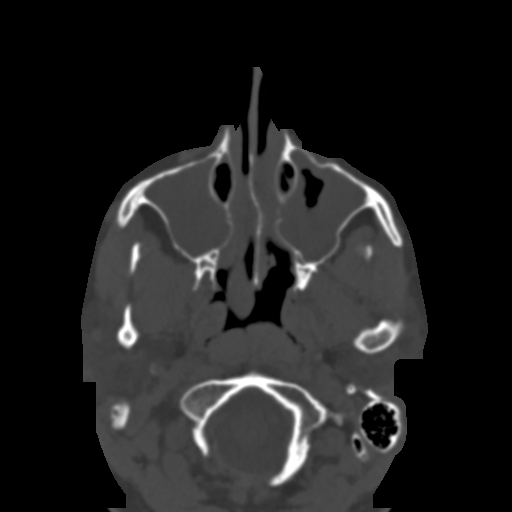
[im 64/142  bone]
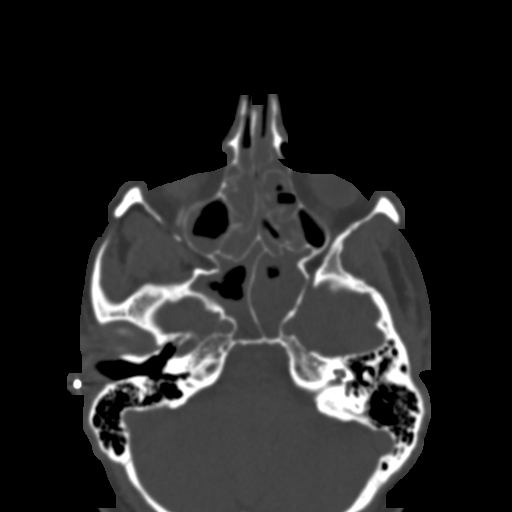
[im 78/142  brain]
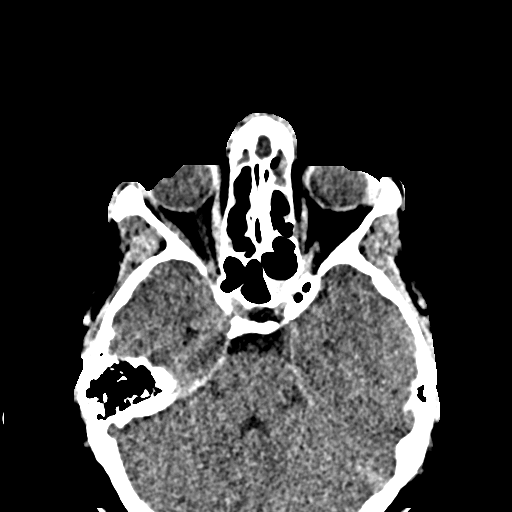
[im 78/142  bone]
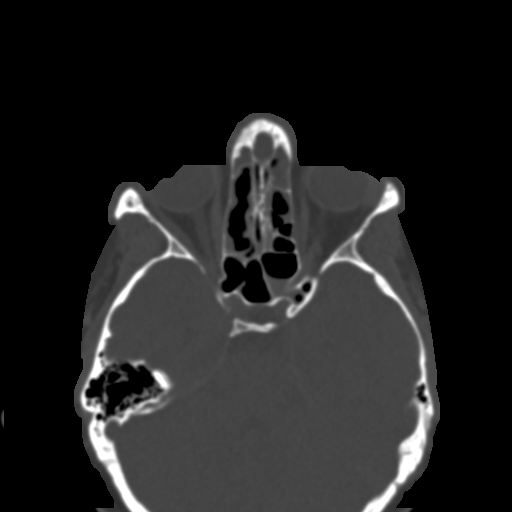
[im 98/142  bone]
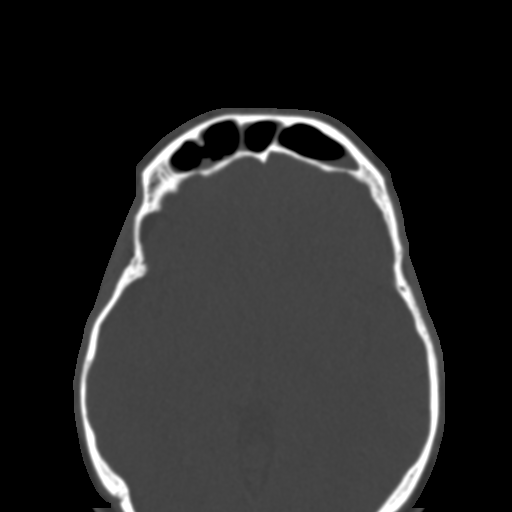
[im 112/142  bone]
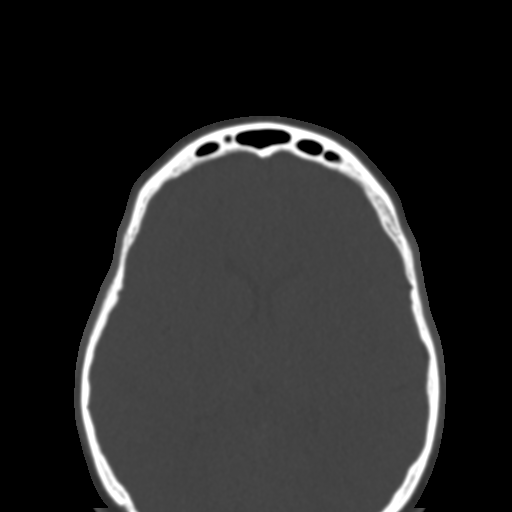
[im 132/142  bone]
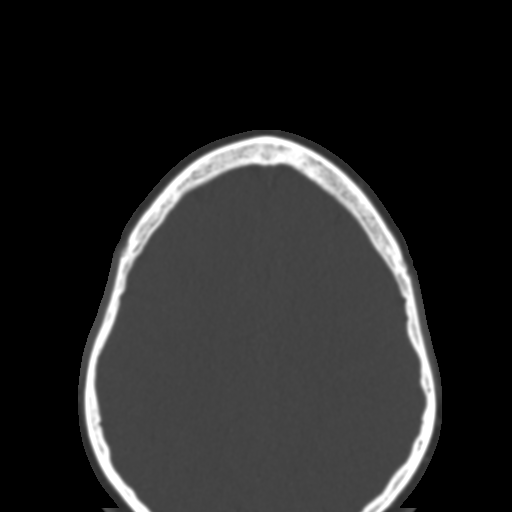

[Series 4: coronal · coronal · 0.29mm/px · 3 of 85 slices shown]
[im 29/85  bone]
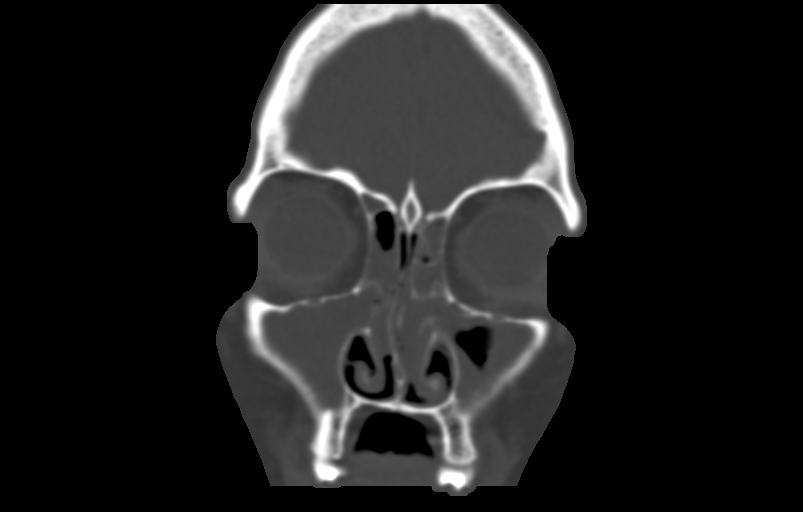
[im 38/85  bone]
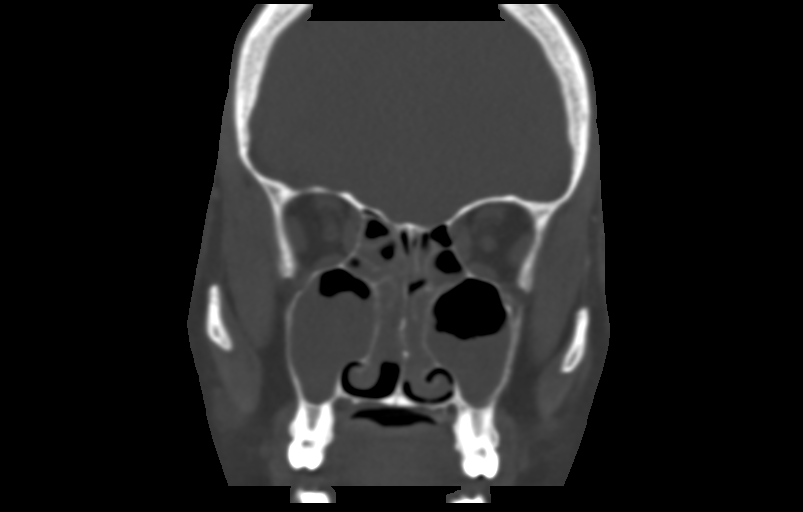
[im 47/85  bone]
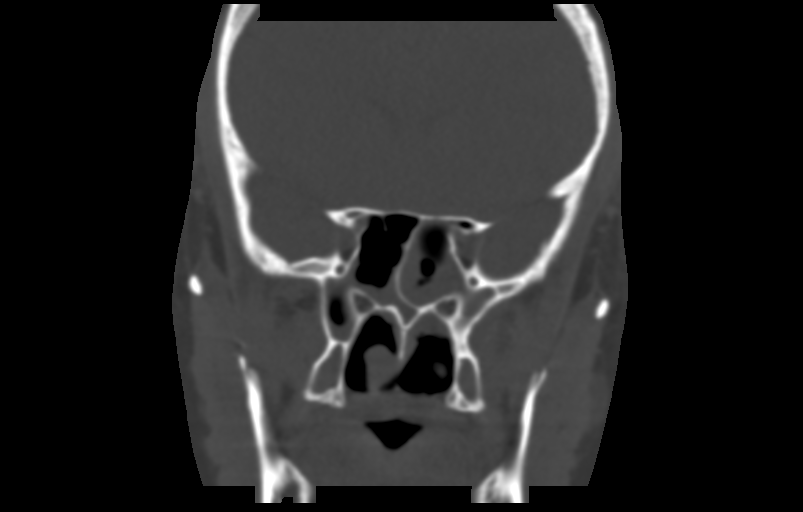

[Series 5: sagittal · sagittal · 0.31mm/px · 3 of 100 slices shown]
[im 34/100  bone]
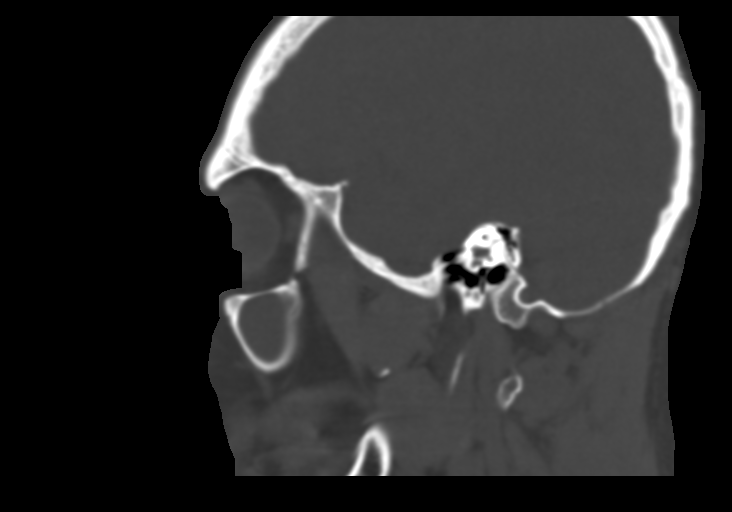
[im 50/100  bone]
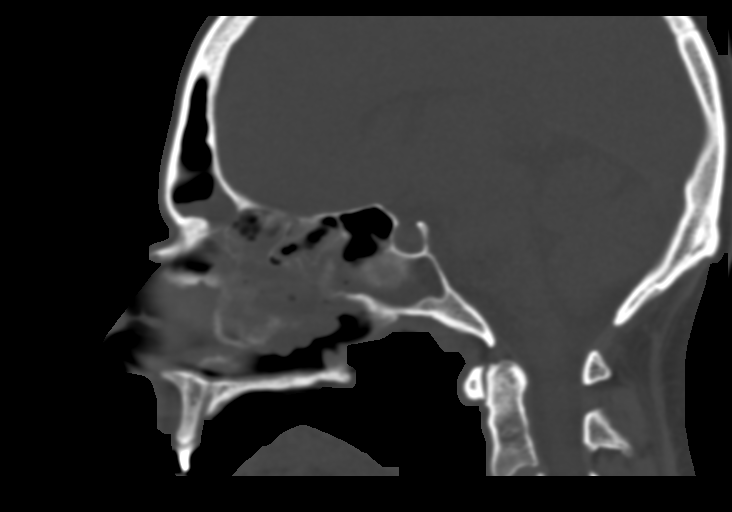
[im 67/100  bone]
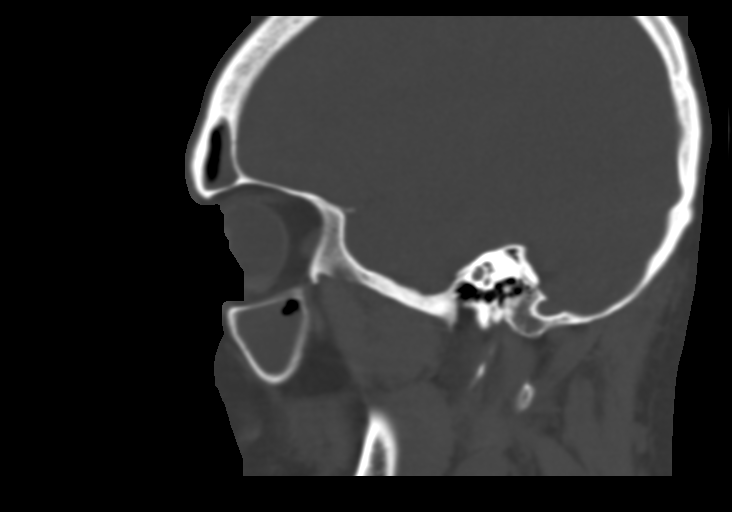

[14 of 47 positions shown; findings below may reference images not displayed]

FINDINGS: SINUSES:

Soft tissue nearly completely effaces the bilateral maxillary,
bilateral sphenoid sinuses. Mild mucosal thickening with bifrontal
sinus air-fluid levels. Moderate anterior posterior ethmoid mucosal
thickening. Nasal septum deviated to the RIGHT. LEFT fovea
ethmoidalis is shallower than the RIGHT. Pneumatized bilateral
anterior clinoids. The carotid canal protrudes into the posterior
sphenoid sinuses with ample bony covering. Pneumatized bilateral
pterygoid bodies. No antral expansion or destructive bony lesions.
Mastoid air cells are well aerated.

OSTIA: Soft tissue effaces the frontoethmoidal, ostiomeatal units
and sphenoethmoidal recesses bilaterally. Lobulated RIGHT posterior
nasal cavity 3.1 x 1.2 cm suspected polyp.

FACIAL BONES: No acute facial fracture. No destructive bony lesions.

ORBITS: Ocular globes and orbital contents are unremarkable.

SOFT TISSUES: No significant soft tissue swelling. No subcutaneous
gas or radiopaque foreign bodies. Included intracranial contents are
normal.
IMPRESSION: Pan paranasal sinusitis with obstructed sinonasal ostia.

Suspected polyp RIGHT posterior nasal cavity.
